# Patient Record
Sex: Female | Born: 2000 | Race: Black or African American | Hispanic: No | Marital: Single
Health system: Southern US, Community
[De-identification: ages and names within clinical notes are randomized; demographics above are authoritative.]

## PROBLEM LIST (undated history)

## (undated) DIAGNOSIS — E785 Hyperlipidemia, unspecified: Secondary | ICD-10-CM

## (undated) HISTORY — DX: Hyperlipidemia, unspecified: E78.5

---
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
## 2022-01-01 DIAGNOSIS — Z3009 Encounter for other general counseling and advice on contraception: Secondary | ICD-10-CM | POA: Diagnosis not present

## 2022-01-01 DIAGNOSIS — Z113 Encounter for screening for infections with a predominantly sexual mode of transmission: Secondary | ICD-10-CM | POA: Diagnosis not present

## 2022-01-01 DIAGNOSIS — Z3041 Encounter for surveillance of contraceptive pills: Secondary | ICD-10-CM | POA: Diagnosis not present

## 2022-06-11 DIAGNOSIS — I10 Essential (primary) hypertension: Secondary | ICD-10-CM | POA: Diagnosis not present

## 2022-06-11 DIAGNOSIS — Z7251 High risk heterosexual behavior: Secondary | ICD-10-CM | POA: Diagnosis not present

## 2022-06-11 DIAGNOSIS — N390 Urinary tract infection, site not specified: Secondary | ICD-10-CM | POA: Diagnosis not present

## 2022-06-11 DIAGNOSIS — Z79899 Other long term (current) drug therapy: Secondary | ICD-10-CM | POA: Diagnosis not present

## 2022-06-11 DIAGNOSIS — E559 Vitamin D deficiency, unspecified: Secondary | ICD-10-CM | POA: Diagnosis not present

## 2022-06-11 DIAGNOSIS — Z131 Encounter for screening for diabetes mellitus: Secondary | ICD-10-CM | POA: Diagnosis not present

## 2022-06-11 DIAGNOSIS — Z3042 Encounter for surveillance of injectable contraceptive: Secondary | ICD-10-CM | POA: Diagnosis not present

## 2022-06-11 DIAGNOSIS — N911 Secondary amenorrhea: Secondary | ICD-10-CM | POA: Diagnosis not present

## 2022-06-11 DIAGNOSIS — Z0000 Encounter for general adult medical examination without abnormal findings: Secondary | ICD-10-CM | POA: Diagnosis not present

## 2022-06-11 DIAGNOSIS — A499 Bacterial infection, unspecified: Secondary | ICD-10-CM | POA: Diagnosis not present

## 2022-06-11 DIAGNOSIS — L309 Dermatitis, unspecified: Secondary | ICD-10-CM | POA: Diagnosis not present

## 2022-06-17 DIAGNOSIS — R21 Rash and other nonspecific skin eruption: Secondary | ICD-10-CM | POA: Diagnosis not present

## 2022-06-17 DIAGNOSIS — N911 Secondary amenorrhea: Secondary | ICD-10-CM | POA: Diagnosis not present

## 2022-06-17 DIAGNOSIS — A568 Sexually transmitted chlamydial infection of other sites: Secondary | ICD-10-CM | POA: Diagnosis not present

## 2022-06-17 DIAGNOSIS — I10 Essential (primary) hypertension: Secondary | ICD-10-CM | POA: Diagnosis not present

## 2022-06-17 DIAGNOSIS — E559 Vitamin D deficiency, unspecified: Secondary | ICD-10-CM | POA: Diagnosis not present

## 2022-06-17 DIAGNOSIS — Z79899 Other long term (current) drug therapy: Secondary | ICD-10-CM | POA: Diagnosis not present

## 2022-06-17 DIAGNOSIS — R350 Frequency of micturition: Secondary | ICD-10-CM | POA: Diagnosis not present

## 2022-06-17 DIAGNOSIS — R030 Elevated blood-pressure reading, without diagnosis of hypertension: Secondary | ICD-10-CM | POA: Diagnosis not present

## 2022-06-17 DIAGNOSIS — Z6829 Body mass index (BMI) 29.0-29.9, adult: Secondary | ICD-10-CM | POA: Diagnosis not present

## 2022-07-24 DIAGNOSIS — L810 Postinflammatory hyperpigmentation: Secondary | ICD-10-CM | POA: Diagnosis not present

## 2022-07-24 DIAGNOSIS — L309 Dermatitis, unspecified: Secondary | ICD-10-CM | POA: Diagnosis not present

## 2022-07-26 DIAGNOSIS — Z3042 Encounter for surveillance of injectable contraceptive: Secondary | ICD-10-CM | POA: Diagnosis not present

## 2022-08-23 DIAGNOSIS — Z6829 Body mass index (BMI) 29.0-29.9, adult: Secondary | ICD-10-CM | POA: Diagnosis not present

## 2022-08-23 DIAGNOSIS — Z3200 Encounter for pregnancy test, result unknown: Secondary | ICD-10-CM | POA: Diagnosis not present

## 2022-08-23 DIAGNOSIS — R7309 Other abnormal glucose: Secondary | ICD-10-CM | POA: Diagnosis not present

## 2022-08-23 DIAGNOSIS — R5383 Other fatigue: Secondary | ICD-10-CM | POA: Diagnosis not present

## 2022-08-23 DIAGNOSIS — Z79899 Other long term (current) drug therapy: Secondary | ICD-10-CM | POA: Diagnosis not present

## 2022-08-23 DIAGNOSIS — Z7251 High risk heterosexual behavior: Secondary | ICD-10-CM | POA: Diagnosis not present

## 2022-08-23 DIAGNOSIS — N939 Abnormal uterine and vaginal bleeding, unspecified: Secondary | ICD-10-CM | POA: Diagnosis not present

## 2022-12-01 DIAGNOSIS — Z3042 Encounter for surveillance of injectable contraceptive: Secondary | ICD-10-CM | POA: Diagnosis not present

## 2022-12-01 DIAGNOSIS — Z131 Encounter for screening for diabetes mellitus: Secondary | ICD-10-CM | POA: Diagnosis not present

## 2022-12-01 DIAGNOSIS — Z6829 Body mass index (BMI) 29.0-29.9, adult: Secondary | ICD-10-CM | POA: Diagnosis not present

## 2022-12-01 DIAGNOSIS — N911 Secondary amenorrhea: Secondary | ICD-10-CM | POA: Diagnosis not present

## 2022-12-01 DIAGNOSIS — L309 Dermatitis, unspecified: Secondary | ICD-10-CM | POA: Diagnosis not present

## 2023-01-12 ENCOUNTER — Ambulatory Visit (HOSPITAL_COMMUNITY)
Admission: EM | Admit: 2023-01-12 | Discharge: 2023-01-12 | Disposition: A | Discharge status: Home / Self Care | Payer: Medicaid Other | Attending: Family Medicine | Admitting: Family Medicine

## 2023-01-12 ENCOUNTER — Other Ambulatory Visit: Payer: Self-pay

## 2023-01-12 ENCOUNTER — Encounter (HOSPITAL_COMMUNITY): Payer: Self-pay | Admitting: *Deleted

## 2023-01-12 ENCOUNTER — Ambulatory Visit (INDEPENDENT_AMBULATORY_CARE_PROVIDER_SITE_OTHER): Payer: Medicaid Other

## 2023-01-12 DIAGNOSIS — W19XXXA Unspecified fall, initial encounter: Secondary | ICD-10-CM | POA: Diagnosis not present

## 2023-01-12 DIAGNOSIS — M25531 Pain in right wrist: Secondary | ICD-10-CM

## 2023-01-12 MED ORDER — IBUPROFEN 800 MG PO TABS: 800.0000 mg | ORAL_TABLET | Freq: Three times a day (TID) | ORAL | 0 refills | Status: DC | PRN

## 2023-01-12 NOTE — Discharge Instructions (Signed)
The x-rays were negative for any broken bones  Take ibuprofen 800 mg--1 tab every 8 hours as needed for pain.  Ice and elevate your wrist.

## 2023-01-12 NOTE — ED Provider Notes (Signed)
[facility]    CSN: [id number] Arrival date & time: 01/12/23  1005      History   Chief Complaint Chief Complaint  Patient presents with   Wrist Pain    HPI Destiny Ross is a 22 y.o. female.    Wrist Pain   Here for right wrist pain.  On 3/31 she wax cleaning a machine at work, and it fell on her right wrist. She notes pain in proximal dorsum of the right wrist.  There is also little swelling there.  It hurts to flex and extend at the wrist.  She has not taken anything for the pain so far.  Menstrual cycles are irregular as she is on the Depo-Provera.    Past Medical History:  Diagnosis Date   Hyperlipidemia     Patient Active Problem List   Diagnosis Date Noted   Adjustment disorder with mixed anxiety and depressed mood 12/04/2015   Adiposity 10/22/2015    History reviewed. No pertinent surgical history.  OB History   No obstetric history on file.      Home Medications    Prior to Admission medications   Medication Sig Start Date End Date Taking? Authorizing Provider  ibuprofen (ADVIL) 800 MG tablet Take 1 tablet (800 mg total) by mouth every 8 (eight) hours as needed (pain). 01/12/23  Yes Barrett Henle, MD    Family History Family History  Problem Relation Age of Onset   Diabetes Maternal Grandfather    Hyperlipidemia Other    Hypertension Other    Asthma Other    Cancer Other     Social History Social History   Tobacco Use   Smoking status: Unknown     Allergies   Patient has no known allergies.   Review of Systems Review of Systems   Physical Exam Triage Vital Signs ED Triage Vitals  Enc Vitals Group     BP 01/12/23 1057 121/80     Pulse Rate 01/12/23 1057 75     Resp 01/12/23 1057 18     Temp 01/12/23 1057 98.1 F (36.7 C)     Temp src --      SpO2 01/12/23 1057 98 %     Weight --      Height --      Head Circumference --      Peak Flow --      Pain Score 01/12/23 1055 5     Pain Loc --      Pain  Edu? --      Excl. in [place]? --    No data found.  Updated Vital Signs BP 121/80   Pulse 75   Temp 98.1 F (36.7 C)   Resp 18   SpO2 98%   Visual Acuity Right Eye Distance:   Left Eye Distance:   Bilateral Distance:    Right Eye Near:   Left Eye Near:    Bilateral Near:     Physical Exam Vitals reviewed.  Constitutional:      General: She is not in acute distress.    Appearance: She is not ill-appearing, toxic-appearing or diaphoretic.  Musculoskeletal:     Comments: There is some swelling and tenderness about 4 cm in diameter on the dorsum of the right wrist on the proximal aspect of the wrist.  No erythema and no fluctuance.  Skin:    Capillary Refill: Capillary refill takes less than 2 seconds.  Neurological:     General: No focal deficit  present.     Mental Status: She is alert and oriented to person, place, and time.  Psychiatric:        Behavior: Behavior normal.      UC Treatments / Results  Labs (all labs ordered are listed, but only abnormal results are displayed) Labs Reviewed - No data to display  EKG   Radiology DG Wrist Complete Right  Result Date: 01/12/2023 CLINICAL DATA:  Meat slicer fell on RIGHT wrist, pain EXAM: RIGHT WRIST - COMPLETE 3+ VIEW COMPARISON:  None Available. FINDINGS: Osseous mineralization normal. Joint spaces preserved. No acute fracture, dislocation, or bone destruction. IMPRESSION: No acute abnormalities. Electronically Signed   By: Lavonia Dana M.D.   On: 01/12/2023 11:14    Procedures Procedures (including critical care time)  Medications Ordered in UC Medications - No data to display  Initial Impression / Assessment and Plan / UC Course  I have reviewed the triage vital signs and the nursing notes.  Pertinent labs & imaging results that were available during my care of the patient were reviewed by me and considered in my medical decision making (see chart for details).        Wrist splint is applied and ibuprofen  is sent for pain.  She will ice it in these next 24 hours. Final Clinical Impressions(s) / UC Diagnoses   Final diagnoses:  Right wrist pain     Discharge Instructions      The x-rays were negative for any broken bones  Take ibuprofen 800 mg--1 tab every 8 hours as needed for pain.  Ice and elevate your wrist.     ED Prescriptions     Medication Sig Dispense Auth. Provider   ibuprofen (ADVIL) 800 MG tablet Take 1 tablet (800 mg total) by mouth every 8 (eight) hours as needed (pain). 21 tablet Alfio Loescher, Gwenlyn Perking, MD      PDMP not reviewed this encounter.   Barrett Henle, MD 01/12/23 1131

## 2023-01-12 NOTE — ED Triage Notes (Signed)
Pt reports on the 01-10-23 rt wrist pain after the meat slicer she was cleaning at work fell on top of her Rt wrist.

## 2023-02-27 DIAGNOSIS — F322 Major depressive disorder, single episode, severe without psychotic features: Secondary | ICD-10-CM | POA: Diagnosis not present

## 2023-02-27 DIAGNOSIS — Z1331 Encounter for screening for depression: Secondary | ICD-10-CM | POA: Diagnosis not present

## 2023-02-27 DIAGNOSIS — Z6831 Body mass index (BMI) 31.0-31.9, adult: Secondary | ICD-10-CM | POA: Diagnosis not present

## 2023-02-27 DIAGNOSIS — Z1339 Encounter for screening examination for other mental health and behavioral disorders: Secondary | ICD-10-CM | POA: Diagnosis not present

## 2023-02-27 DIAGNOSIS — F411 Generalized anxiety disorder: Secondary | ICD-10-CM | POA: Diagnosis not present

## 2023-03-01 DIAGNOSIS — Z6831 Body mass index (BMI) 31.0-31.9, adult: Secondary | ICD-10-CM | POA: Diagnosis not present

## 2023-03-01 DIAGNOSIS — Z131 Encounter for screening for diabetes mellitus: Secondary | ICD-10-CM | POA: Diagnosis not present

## 2023-03-01 DIAGNOSIS — Z79899 Other long term (current) drug therapy: Secondary | ICD-10-CM | POA: Diagnosis not present

## 2023-03-01 DIAGNOSIS — E559 Vitamin D deficiency, unspecified: Secondary | ICD-10-CM | POA: Diagnosis not present

## 2023-03-01 DIAGNOSIS — Z3042 Encounter for surveillance of injectable contraceptive: Secondary | ICD-10-CM | POA: Diagnosis not present

## 2023-03-01 DIAGNOSIS — D539 Nutritional anemia, unspecified: Secondary | ICD-10-CM | POA: Diagnosis not present

## 2023-03-01 DIAGNOSIS — N911 Secondary amenorrhea: Secondary | ICD-10-CM | POA: Diagnosis not present

## 2023-03-01 DIAGNOSIS — L309 Dermatitis, unspecified: Secondary | ICD-10-CM | POA: Diagnosis not present

## 2023-03-27 DIAGNOSIS — Z6831 Body mass index (BMI) 31.0-31.9, adult: Secondary | ICD-10-CM | POA: Diagnosis not present

## 2023-03-27 DIAGNOSIS — F411 Generalized anxiety disorder: Secondary | ICD-10-CM | POA: Diagnosis not present

## 2023-03-27 DIAGNOSIS — F322 Major depressive disorder, single episode, severe without psychotic features: Secondary | ICD-10-CM | POA: Diagnosis not present

## 2023-04-30 DIAGNOSIS — F331 Major depressive disorder, recurrent, moderate: Secondary | ICD-10-CM | POA: Diagnosis not present

## 2023-04-30 DIAGNOSIS — F411 Generalized anxiety disorder: Secondary | ICD-10-CM | POA: Diagnosis not present

## 2023-04-30 DIAGNOSIS — F1010 Alcohol abuse, uncomplicated: Secondary | ICD-10-CM | POA: Diagnosis not present

## 2023-11-02 DIAGNOSIS — Z789 Other specified health status: Secondary | ICD-10-CM | POA: Diagnosis not present

## 2023-11-02 DIAGNOSIS — M129 Arthropathy, unspecified: Secondary | ICD-10-CM | POA: Diagnosis not present

## 2023-11-02 DIAGNOSIS — E236 Other disorders of pituitary gland: Secondary | ICD-10-CM | POA: Diagnosis not present

## 2023-11-02 DIAGNOSIS — E559 Vitamin D deficiency, unspecified: Secondary | ICD-10-CM | POA: Diagnosis not present

## 2023-11-02 DIAGNOSIS — E663 Overweight: Secondary | ICD-10-CM | POA: Diagnosis not present

## 2023-11-02 DIAGNOSIS — Z0000 Encounter for general adult medical examination without abnormal findings: Secondary | ICD-10-CM | POA: Diagnosis not present

## 2023-11-02 DIAGNOSIS — K5900 Constipation, unspecified: Secondary | ICD-10-CM | POA: Diagnosis not present

## 2023-11-02 DIAGNOSIS — D539 Nutritional anemia, unspecified: Secondary | ICD-10-CM | POA: Diagnosis not present

## 2023-11-02 DIAGNOSIS — L309 Dermatitis, unspecified: Secondary | ICD-10-CM | POA: Diagnosis not present

## 2023-11-02 DIAGNOSIS — E538 Deficiency of other specified B group vitamins: Secondary | ICD-10-CM | POA: Diagnosis not present

## 2023-11-16 DIAGNOSIS — E559 Vitamin D deficiency, unspecified: Secondary | ICD-10-CM | POA: Diagnosis not present

## 2023-11-16 DIAGNOSIS — K5900 Constipation, unspecified: Secondary | ICD-10-CM | POA: Diagnosis not present

## 2023-11-16 DIAGNOSIS — Z3042 Encounter for surveillance of injectable contraceptive: Secondary | ICD-10-CM | POA: Diagnosis not present

## 2023-11-16 DIAGNOSIS — L309 Dermatitis, unspecified: Secondary | ICD-10-CM | POA: Diagnosis not present

## 2023-11-16 DIAGNOSIS — E611 Iron deficiency: Secondary | ICD-10-CM | POA: Diagnosis not present

## 2023-11-22 DIAGNOSIS — Z3042 Encounter for surveillance of injectable contraceptive: Secondary | ICD-10-CM | POA: Diagnosis not present

## 2024-05-26 DIAGNOSIS — Z0000 Encounter for general adult medical examination without abnormal findings: Secondary | ICD-10-CM | POA: Diagnosis not present

## 2024-05-26 DIAGNOSIS — R0789 Other chest pain: Secondary | ICD-10-CM | POA: Diagnosis not present

## 2024-05-30 ENCOUNTER — Emergency Department (HOSPITAL_BASED_OUTPATIENT_CLINIC_OR_DEPARTMENT_OTHER)
Admission: EM | Admit: 2024-05-30 | Discharge: 2024-05-30 | Disposition: A | Discharge status: Home / Self Care | Attending: Emergency Medicine | Admitting: Emergency Medicine

## 2024-05-30 ENCOUNTER — Other Ambulatory Visit: Payer: Self-pay

## 2024-05-30 DIAGNOSIS — S39012A Strain of muscle, fascia and tendon of lower back, initial encounter: Secondary | ICD-10-CM | POA: Diagnosis not present

## 2024-05-30 DIAGNOSIS — X58XXXA Exposure to other specified factors, initial encounter: Secondary | ICD-10-CM | POA: Insufficient documentation

## 2024-05-30 DIAGNOSIS — M545 Low back pain, unspecified: Secondary | ICD-10-CM | POA: Diagnosis present

## 2024-05-30 MED ORDER — IBUPROFEN 600 MG PO TABS: 600.0000 mg | ORAL_TABLET | Freq: Four times a day (QID) | ORAL | 0 refills | Status: AC | PRN

## 2024-05-30 NOTE — ED Provider Notes (Signed)
 [place] EMERGENCY DEPARTMENT AT [facility] Provider Note   CSN: [id number] Arrival date & time: 05/30/24  1036     Patient presents with: Back Pain   Destiny Ross is a 23 y.o. female who presents to the ED with concerns for lower back pain.  She also states that she has some lower extremity edema but she is on her feet for prolonged amounts of time at her job at a [employer].  She denies have any distal numbness or paresthesia, has no abnormalities with her gait, and is ambulatory without assistance.  Back pain is a intermittent aching that occasionally is in the lower back but is also up the middle of the back going up to the shoulders.  No specific aggravating factors, no recent falls or injuries.    Back Pain      Prior to Admission medications   Medication Sig Start Date End Date Taking? Authorizing Provider  ibuprofen  (ADVIL ) 600 MG tablet Take 1 tablet (600 mg total) by mouth every 6 (six) hours as needed. 05/30/24  Yes Myriam Dorn BROCKS, PA    Allergies: Patient has no known allergies.    Review of Systems  Musculoskeletal:  Positive for back pain.  All other systems reviewed and are negative.   Updated Vital Signs BP 126/82   Pulse 74   Temp 98.2 F (36.8 C) (Oral)   Resp 16   LMP 05/26/2024 (Approximate)   SpO2 100%   Physical Exam Vitals and nursing note reviewed.  Constitutional:      General: She is not in acute distress.    Appearance: She is well-developed.  HENT:     Head: Normocephalic and atraumatic.  Eyes:     Conjunctiva/sclera: Conjunctivae normal.  Cardiovascular:     Rate and Rhythm: Normal rate and regular rhythm.     Heart sounds: No murmur heard. Pulmonary:     Effort: Pulmonary effort is normal. No respiratory distress.     Breath sounds: Normal breath sounds.  Abdominal:     Palpations: Abdomen is soft.     Tenderness: There is no abdominal tenderness.  Musculoskeletal:        General: No swelling.      Cervical back: Neck supple.     Lumbar back: Tenderness present.     Comments: Tenderness along the paraspinals of the lumbar spine without any midline spinal tenderness.  No spinal deformity appreciated.  Skin:    General: Skin is warm and dry.     Capillary Refill: Capillary refill takes less than 2 seconds.  Neurological:     Mental Status: She is alert.  Psychiatric:        Mood and Affect: Mood normal.     (all labs ordered are listed, but only abnormal results are displayed) Labs Reviewed - No data to display  EKG: None  Radiology: No results found.   Procedures   Medications Ordered in the ED - No data to display                                  Medical Decision Making Risk Prescription drug management.   Patient on initial assessment of this patient, she does not have any midline spinal tenderness nor does she have any recent injuries or other aggravating factors.  Given that her pain is isolated to the paraspinal muscles in the lumbar spine, believe this is secondary to a  chronic muscular strain of the lumbar spine, and will manage outpatient with NSAIDs and heat application.  Further she does not have any concerning signs for lumbar radiculopathy, she has no distal numbness or paresthesias, she has a normal gait and is ambulatory without assistance.  As such, find her stable for discharge and follow-up with primary care.  This was explained to the patient, she understands and agrees has no further concerns at this time.     Final diagnoses:  Back strain, initial encounter    ED Discharge Orders          Ordered    ibuprofen  (ADVIL ) 600 MG tablet  Every 6 hours PRN        05/30/24 1211               Myriam Dorn BROCKS, [place] 05/30/24 1214    Tegeler, Lonni PARAS, MD 05/30/24 1217
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
## 2024-12-04 ENCOUNTER — Ambulatory Visit (HOSPITAL_COMMUNITY): Admitting: Clinical

## 2024-12-05 ENCOUNTER — Encounter (HOSPITAL_BASED_OUTPATIENT_CLINIC_OR_DEPARTMENT_OTHER): Admitting: Obstetrics and Gynecology
